# Patient Record
Sex: Male | Born: 1990 | Race: White | Hispanic: No | Marital: Single
Health system: Southern US, Community
[De-identification: ages and names within clinical notes are randomized; demographics above are authoritative.]

---
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
## 2013-01-12 ENCOUNTER — Encounter (HOSPITAL_COMMUNITY): Payer: Self-pay | Admitting: *Deleted

## 2013-01-12 ENCOUNTER — Emergency Department (HOSPITAL_COMMUNITY): Payer: Self-pay

## 2013-01-12 ENCOUNTER — Emergency Department (HOSPITAL_COMMUNITY)
Admission: EM | Admit: 2013-01-12 | Discharge: 2013-01-12 | Disposition: A | Discharge status: Home / Self Care | Payer: Self-pay | Attending: Emergency Medicine | Admitting: Emergency Medicine

## 2013-01-12 DIAGNOSIS — R05 Cough: Secondary | ICD-10-CM | POA: Insufficient documentation

## 2013-01-12 DIAGNOSIS — J209 Acute bronchitis, unspecified: Secondary | ICD-10-CM | POA: Insufficient documentation

## 2013-01-12 DIAGNOSIS — R059 Cough, unspecified: Secondary | ICD-10-CM | POA: Insufficient documentation

## 2013-01-12 DIAGNOSIS — F17200 Nicotine dependence, unspecified, uncomplicated: Secondary | ICD-10-CM | POA: Insufficient documentation

## 2013-01-12 MED ORDER — AZITHROMYCIN 250 MG PO TABS: ORAL_TABLET | ORAL | Status: AC

## 2013-01-12 MED ORDER — CEFTRIAXONE SODIUM 500 MG IJ SOLR
INTRAMUSCULAR | Status: AC
  Filled 2013-01-12: qty 500

## 2013-01-12 NOTE — ED Notes (Signed)
Left sided cp, worse with coughing and deep breathing x 2 days.  Also reporting pain to left flank.

## 2013-01-12 NOTE — ED Provider Notes (Signed)
This chart was scribed for Carleene Cooper III, MD, by Candelaria Stagers, ED Scribe. This patient was seen in room APA09/APA09 and the patient's care was started at 5:30 PM  CSN: [id number]     Arrival date & time 01/12/13  1524 History     First MD Initiated Contact with Patient 01/12/13 1711     Chief Complaint  Patient presents with  . Chest Pain    The history is provided by the patient. No language interpreter was used.   HPI Comments: [name] is a 22 y.o. male who presents to the Emergency Department complaining of left sided chest pain that started about two weeks ago.  He is also experiencing a cough.  The pain is worse with coughing and deep breathing.  Pt states he recently started a new job in which he does a lot of heavy lifting.  He denies fever.  He has taken tylenol with no relief.    History reviewed. No pertinent past medical history. History reviewed. No pertinent past surgical history. No family history on file. History  Substance Use Topics  . Smoking status: Current Every Day Smoker    Types: Cigarettes  . Smokeless tobacco: Not on file  . Alcohol Use: No    Review of Systems  Constitutional: Negative for fever and chills.  HENT: Negative for ear pain and sore throat.   Respiratory: Positive for cough.   Cardiovascular: Positive for chest pain.  Gastrointestinal: Negative for nausea, vomiting and diarrhea.  Genitourinary: Negative for difficulty urinating.  Musculoskeletal: Negative for back pain.  Skin: Negative for rash.  Neurological: Negative for syncope.  Psychiatric/Behavioral: Negative for confusion.    Allergies  Review of patient's allergies indicates no known allergies.  Home Medications  No current outpatient prescriptions on file. BP 132/72  Pulse 91  Temp(Src) 98.6 F (37 C) (Oral)  Resp 18  Ht 6\' 2"  (1.88 m)  Wt 284 lb 2 oz (128.878 kg)  BMI 36.46 kg/m2  SpO2 99% Physical Exam  Nursing note and vitals  reviewed. Constitutional: He is oriented to person, place, and time. He appears well-developed and well-nourished. No distress.  HENT:  Head: Normocephalic and atraumatic.  Eyes: EOM are normal.  Neck: Neck supple. No tracheal deviation present.  Cardiovascular: Normal rate.   Pulmonary/Chest: Effort normal. No respiratory distress.  Loose cough during exam  Musculoskeletal: Normal range of motion.  Neurological: He is alert and oriented to person, place, and time.  Skin: Skin is warm and dry.  Psychiatric: He has a normal mood and affect. His behavior is normal.    ED Course   Procedures  DIAGNOSTIC STUDIES: Oxygen Saturation is 99% on room air, normal by my interpretation.    COORDINATION OF CARE:   5:38 PM Discussed course of care with pt which includes antibiotic treatment for bronchitis.  Pt asked for a work note.  Pt understands and agrees.    Date: 01/12/2013  Rate: 87  Rhythm: normal sinus rhythm  QRS Axis: normal  Intervals: normal  ST/T Wave abnormalities: normal  Conduction Disutrbances:none  Narrative Interpretation: Normal EKG  Old EKG Reviewed: none available    Labs Reviewed - No data to display Dg Chest 2 View  01/12/2013   *RADIOLOGY REPORT*  Clinical Data: Chest pain for several weeks, smoking history  CHEST - 2 VIEW  Comparison: Chest x-ray of 03/01/2010  Findings: No active infiltrate or effusion is seen.  Mediastinal contours appear stable.  The heart is within normal  limits in size. No bony abnormality is seen.  IMPRESSION: No active lung disease.   Original Report Authenticated By: Dwyane Dee, M.D.   Course in ED:  Has had loose cough, pain in left lateral chest.  Exam negative.  Rx Z-Pak.  Advised to stop smoking.   1. Acute bronchitis    I personally performed the services described in this documentation, which was scribed in my presence. The recorded information has been reviewed and is accurate.  Osvaldo Human, MD     Carleene Cooper  III, MD 01/12/13 [phone]

## 2013-12-01 ENCOUNTER — Encounter (HOSPITAL_COMMUNITY): Payer: Self-pay | Admitting: Emergency Medicine

## 2013-12-01 ENCOUNTER — Emergency Department (HOSPITAL_COMMUNITY)
Admission: EM | Admit: 2013-12-01 | Discharge: 2013-12-01 | Disposition: A | Discharge status: Home / Self Care | Payer: Self-pay | Attending: Emergency Medicine | Admitting: Emergency Medicine

## 2013-12-01 ENCOUNTER — Ambulatory Visit (HOSPITAL_COMMUNITY)
Admit: 2013-12-01 | Discharge: 2013-12-01 | Disposition: A | Discharge status: Home / Self Care | Payer: Self-pay | Source: Ambulatory Visit | Attending: Emergency Medicine | Admitting: Emergency Medicine

## 2013-12-01 DIAGNOSIS — Z792 Long term (current) use of antibiotics: Secondary | ICD-10-CM | POA: Insufficient documentation

## 2013-12-01 DIAGNOSIS — R42 Dizziness and giddiness: Secondary | ICD-10-CM | POA: Insufficient documentation

## 2013-12-01 DIAGNOSIS — F17200 Nicotine dependence, unspecified, uncomplicated: Secondary | ICD-10-CM | POA: Insufficient documentation

## 2013-12-01 DIAGNOSIS — K838 Other specified diseases of biliary tract: Secondary | ICD-10-CM | POA: Insufficient documentation

## 2013-12-01 DIAGNOSIS — K824 Cholesterolosis of gallbladder: Secondary | ICD-10-CM | POA: Insufficient documentation

## 2013-12-01 DIAGNOSIS — R112 Nausea with vomiting, unspecified: Secondary | ICD-10-CM | POA: Insufficient documentation

## 2013-12-01 DIAGNOSIS — R1011 Right upper quadrant pain: Secondary | ICD-10-CM | POA: Insufficient documentation

## 2013-12-01 LAB — LIPASE, BLOOD: LIPASE: 12 U/L (ref 11–59)

## 2013-12-01 LAB — COMPREHENSIVE METABOLIC PANEL
ALBUMIN: 4.2 g/dL (ref 3.5–5.2)
ALK PHOS: 93 U/L (ref 39–117)
ALT: 12 U/L (ref 0–53)
AST: 18 U/L (ref 0–37)
BUN: 13 mg/dL (ref 6–23)
CHLORIDE: 105 meq/L (ref 96–112)
CO2: 23 mEq/L (ref 19–32)
Calcium: 8.8 mg/dL (ref 8.4–10.5)
Creatinine, Ser: 0.86 mg/dL (ref 0.50–1.35)
GFR calc Af Amer: >90 mL/min (ref >90)
GFR calc non Af Amer: >90 mL/min (ref >90)
Glucose, Bld: 88 mg/dL (ref 70–99)
POTASSIUM: 3.5 meq/L — AB (ref 3.7–5.3)
SODIUM: 142 meq/L (ref 137–147)
TOTAL PROTEIN: 7 g/dL (ref 6.0–8.3)
Total Bilirubin: 0.5 mg/dL (ref 0.3–1.2)

## 2013-12-01 LAB — CBC WITH DIFFERENTIAL/PLATELET
BASOS ABS: 0 10*3/uL (ref 0.0–0.1)
BASOS PCT: 0 % (ref 0–1)
EOS ABS: 0.1 10*3/uL (ref 0.0–0.7)
Eosinophils Relative: 2 % (ref 0–5)
HCT: 38.4 % — ABNORMAL LOW (ref 39.0–52.0)
Hemoglobin: 13.5 g/dL (ref 13.0–17.0)
Lymphocytes Relative: 47 % — ABNORMAL HIGH (ref 12–46)
Lymphs Abs: 3.5 10*3/uL (ref 0.7–4.0)
MCH: 31.3 pg (ref 26.0–34.0)
MCHC: 35.2 g/dL (ref 30.0–36.0)
MCV: 89.1 fL (ref 78.0–100.0)
Monocytes Absolute: 0.5 10*3/uL (ref 0.1–1.0)
Monocytes Relative: 6 % (ref 3–12)
NEUTROS ABS: 3.3 10*3/uL (ref 1.7–7.7)
NEUTROS PCT: 45 % (ref 43–77)
PLATELETS: 217 10*3/uL (ref 150–400)
RBC: 4.31 MIL/uL (ref 4.22–5.81)
RDW: 12.7 % (ref 11.5–15.5)
WBC: 7.5 10*3/uL (ref 4.0–10.5)

## 2013-12-01 MED ORDER — PROMETHAZINE HCL 25 MG RE SUPP: 25.0000 mg | Freq: Four times a day (QID) | RECTAL | Status: AC | PRN

## 2013-12-01 MED ORDER — MORPHINE SULFATE 4 MG/ML IJ SOLN
4.0000 mg | Freq: Once | INTRAMUSCULAR | Status: AC
  Administered 2013-12-01: 4 mg via INTRAVENOUS
  Filled 2013-12-01: qty 1

## 2013-12-01 MED ORDER — IBUPROFEN 400 MG PO TABS
ORAL_TABLET | ORAL | Status: AC
  Filled 2013-12-01: qty 1

## 2013-12-01 MED ORDER — IBUPROFEN 400 MG PO TABS
600.0000 mg | ORAL_TABLET | Freq: Once | ORAL | Status: AC
  Administered 2013-12-01: 600 mg via ORAL
  Filled 2013-12-01: qty 2

## 2013-12-01 MED ORDER — HYDROCODONE-ACETAMINOPHEN 5-325 MG PO TABS: 1.0000 | ORAL_TABLET | Freq: Four times a day (QID) | ORAL | Status: AC | PRN

## 2013-12-01 NOTE — ED Provider Notes (Signed)
Right upper quadrant ultrasound reveals gallbladder sludge and a 7 mm polyp. Discussed with patient and his wife. Followup with Dr. Franky MachoMark Jenkins, local general surgeon. No acute abdomen.  Donnetta HutchingBrian Phoenix Dresser, MD 12/01/13 [phone]

## 2013-12-01 NOTE — ED Notes (Signed)
Pt has rlq abdominal pain x 2 weeks with nausea, vomiting and diarrhea.

## 2013-12-01 NOTE — Discharge Instructions (Signed)
We suspect that you have gall stones. You need an official ultrasound however, and so please respond to the radiology department when they call to schedule the ultrasound. Return to the ER if the pain gets worse. If you have gall stones - you will have to see the surgeon, and their contact information is provided.   Biliary Colic  Biliary colic is a steady or irregular pain in the upper abdomen. It is usually under the right side of the rib cage. It happens when gallstones interfere with the normal flow of bile from the gallbladder. Bile is a liquid that helps to digest fats. Bile is made in the liver and stored in the gallbladder. When you eat a meal, bile passes from the gallbladder through the cystic duct and the common bile duct into the small intestine. There, it mixes with partially digested food. If a gallstone blocks either of these ducts, the normal flow of bile is blocked. The muscle cells in the bile duct contract forcefully to try to move the stone. This causes the pain of biliary colic.  SYMPTOMS   A person with biliary colic usually complains of pain in the upper abdomen. This pain can be:  In the center of the upper abdomen just below the breastbone.  In the upper-right part of the abdomen, near the gallbladder and liver.  Spread back toward the right shoulder blade.  Nausea and vomiting.  The pain usually occurs after eating.  Biliary colic is usually triggered by the digestive system's demand for bile. The demand for bile is high after fatty meals. Symptoms can also occur when a person who has been fasting suddenly eats a very large meal. Most episodes of biliary colic pass after 1 to 5 hours. After the most intense pain passes, your abdomen may continue to ache mildly for about 24 hours. DIAGNOSIS  After you describe your symptoms, your caregiver will perform a physical exam. He or she will pay attention to the upper right portion of your belly (abdomen). This is the area of  your liver and gallbladder. An ultrasound will help your caregiver look for gallstones. Specialized scans of the gallbladder may also be done. Blood tests may be done, especially if you have fever or if your pain persists. PREVENTION  Biliary colic can be prevented by controlling the risk factors for gallstones. Some of these risk factors, such as heredity, increasing age, and pregnancy are a normal part of life. Obesity and a high-fat diet are risk factors you can change through a healthy lifestyle. Women going through menopause who take hormone replacement therapy (estrogen) are also more likely to develop biliary colic. TREATMENT   Pain medication may be prescribed.  You may be encouraged to eat a fat-free diet.  If the first episode of biliary colic is severe, or episodes of colic keep retuning, surgery to remove the gallbladder (cholecystectomy) is usually recommended. This procedure can be done through small incisions using an instrument called a laparoscope. The procedure often requires a brief stay in the hospital. Some people can leave the hospital the same day. It is the most widely used treatment in people troubled by painful gallstones. It is effective and safe, with no complications in more than 90% of cases.  If surgery cannot be done, medication that dissolves gallstones may be used. This medication is expensive and can take months or years to work. Only small stones will dissolve.  Rarely, medication to dissolve gallstones is combined with a procedure called shock-wave  lithotripsy. This procedure uses carefully aimed shock waves to break up gallstones. In many people treated with this procedure, gallstones form again within a few years. PROGNOSIS  If gallstones block your cystic duct or common bile duct, you are at risk for repeated episodes of biliary colic. There is also a 25% chance that you will develop a gallbladder infection(acute cholecystitis), or some other complication of  gallstones within 10 to 20 years. If you have surgery, schedule it at a time that is convenient for you and at a time when you are not sick. HOME CARE INSTRUCTIONS   Drink plenty of clear fluids.  Avoid fatty, greasy or fried foods, or any foods that make your pain worse.  Take medications as directed. SEEK MEDICAL CARE IF:   You develop a fever over 100.5 F (38.1 C).  Your pain gets worse over time.  You develop nausea that prevents you from eating and drinking.  You develop vomiting. SEEK IMMEDIATE MEDICAL CARE IF:   You have continuous or severe belly (abdominal) pain which is not relieved with medications.  You develop nausea and vomiting which is not relieved with medications.  You have symptoms of biliary colic and you suddenly develop a fever and shaking chills. This may signal cholecystitis. Call your caregiver immediately.  You develop a yellow color to your skin or the white part of your eyes (jaundice). Document Released: 11/09/2005 Document Revised: 08/31/2011 Document Reviewed: 01/19/2008 [facility] Patient Information 2014 [place], [place].

## 2013-12-01 NOTE — ED Provider Notes (Signed)
CSN: [id number]     Arrival date & time 12/01/13  0017 History  This chart was scribed for Jesus KaplanAnkit Montford Barg, MD by Valera CastleSteven Perry, ED Scribe. This patient was seen in room APA04/APA04 and the patient's care was started at 12:46 AM.    Chief Complaint  Patient presents with  . Abdominal Pain    (Consider location/radiation/quality/duration/timing/severity/associated sxs/prior Treatment) The history is provided by the patient. No language interpreter was used.   HPI Comments: [name] is a 23 y.o. male who presents to the Emergency Department complaining of intermittent RUQ abdominal pain, onset 2 weeks ago, that has worsened and become constant the last 2 days, with associated nausea, vomiting, and diarrhea. He reports 2 episodes of vomiting today. He reports he is unable to keep any solids down. He reports his vomit looks black, yellowish in color. He denies h/o cholecystectomy, other abdominal surgeries. He denies h/o kidney stones. He denies dysuria, hematuria, and any other associated symptoms.    History reviewed. No pertinent past medical history. History reviewed. No pertinent past surgical history. History reviewed. No pertinent family history. History  Substance Use Topics  . Smoking status: Current Every Day Smoker    Types: Cigarettes  . Smokeless tobacco: Not on file  . Alcohol Use: No    Review of Systems  Constitutional: Negative for fever, chills and activity change.  Eyes: Negative for visual disturbance.  Respiratory: Negative for cough, chest tightness and shortness of breath.   Cardiovascular: Negative for chest pain.  Gastrointestinal: Positive for nausea, vomiting and abdominal distention. Negative for diarrhea and blood in stool.  Genitourinary: Negative for dysuria, enuresis and difficulty urinating.  Musculoskeletal: Negative for arthralgias and neck pain.  Neurological: Positive for dizziness. Negative for light-headedness and headaches.   Psychiatric/Behavioral: Negative for confusion.    Allergies  Review of patient's allergies indicates no known allergies.  Home Medications   Prior to Admission medications   Medication Sig Start Date End Date Taking? Authorizing Provider  azithromycin (ZITHROMAX Z-PAK) 250 MG tablet Take 2 tablets today, then take 1 tablet once a day for the next 4 days. 01/12/13   Carleene CooperAlan Davidson III, MD  HYDROcodone-acetaminophen (NORCO/VICODIN) 5-325 MG per tablet Take 1 tablet by mouth every 6 (six) hours as needed. 12/01/13   Jesus KaplanAnkit Kalub Morillo, MD  promethazine (PHENERGAN) 25 MG suppository Place 1 suppository (25 mg total) rectally every 6 (six) hours as needed for nausea. 12/01/13   Tevita Gomer Rhunette CroftNanavati, MD   BP 131/82  Pulse 97  Temp(Src) 98.3 F (36.8 C) (Oral)  Resp 18  Ht 6\' 1"  (1.854 m)  Wt 250 lb (113.399 kg)  BMI 32.99 kg/m2  SpO2 99% Physical Exam  Nursing note and vitals reviewed. Constitutional: He is oriented to person, place, and time. He appears well-developed and well-nourished. No distress.  HENT:  Head: Normocephalic and atraumatic.  Eyes: Conjunctivae and EOM are normal. Pupils are equal, round, and reactive to light.  Neck: Normal range of motion. Neck supple. No tracheal deviation present.  Cardiovascular: Normal rate and regular rhythm.   Pulmonary/Chest: Effort normal and breath sounds normal. No respiratory distress.  Abdominal: Soft. Bowel sounds are normal. He exhibits no distension. There is tenderness. There is no rebound and no guarding.  Epigastric and RUQ tenderness - with no rebound or guarding. US pictures uploaded.  Musculoskeletal: Normal range of motion.  Neurological: He is alert and oriented to person, place, and time.  Skin: Skin is warm and dry.  Psychiatric: He has a  normal mood and affect. His behavior is normal.    ED Course  Procedures (including critical care time) Labs Review Labs Reviewed  CBC WITH DIFFERENTIAL - Abnormal; Notable for the  following:    HCT 38.4 (*)    Lymphocytes Relative 47 (*)    All other components within normal limits  COMPREHENSIVE METABOLIC PANEL - Abnormal; Notable for the following:    Potassium 3.5 (*)    All other components within normal limits  LIPASE, BLOOD    Imaging Review No results found.   EKG Interpretation None      MDM   Final diagnoses:  RUQ abdominal pain  Suspected Cholelithiasis  I personally performed the services described in this documentation, which was scribed in my presence. The recorded information has been reviewed and is accurate.    Gall bladder - with what appears to be stone in the middle. No surrounding fluid   Gall bladder  - stone in the middle, ? Surrounding fluid    Gall bladder, with wall measurement of 0.36 mm, ? Surrounding fluid     EMERGENCY DEPARTMENT BILIARY ULTRASOUND INTERPRETATION "Study: Limited Abdominal Ultrasound of the gallbladder and common bile duct."  INDICATIONS: RUQ pain Indication: Multiple views of the gallbladder and common bile duct were obtained in real-time with a Multi-frequency probe." PERFORMED BY:  Myself IMAGES ARCHIVED?: Yes FINDINGS: Gallstones present, Gallbladder wall thickened >93mm and Sonographic Murphy's sign absent LIMITATIONS: Body Habitus INTERPRETATION: Cholelithiasis   Pt comes in with RUQ pain, constant x 2 days. No abd peritoneal findings, negative Murphy's sign.  US bedside performed by me, and i suspect cholelithiasis. I appreciate some gall bladder wall thickening and with ? Fluid surrounding. I couldn't find CBD.   Labs are WNL, pt has neg murphy's. Plan was to have patient stay in the ER and get US in the AM - however, he is here with his infant daughter and wife and wants to go home. Outpatient US ordered. Return precautions discussed.  Pt very reliable, wife has had surgery in the past, and understands the importance for proper imaging. I feel comfortable discharging him with  the set of information we have, and as patient is reliable.      Jesus KaplanAnkit Jesus Ramires, MD 12/01/13 [phone]
